# Patient Record
Sex: Female | Born: 1965 | Race: White | Hispanic: No | Marital: Single
Health system: Southern US, Community
[De-identification: ages and names within clinical notes are randomized; demographics above are authoritative.]

---

## 2003-12-26 ENCOUNTER — Other Ambulatory Visit: Admission: RE | Admit: 2003-12-26 | Discharge: 2003-12-26 | Payer: Self-pay | Admitting: Family Medicine

## 2007-01-05 ENCOUNTER — Other Ambulatory Visit: Admission: RE | Admit: 2007-01-05 | Discharge: 2007-01-05 | Payer: Self-pay | Admitting: Family Medicine

## 2013-10-31 ENCOUNTER — Other Ambulatory Visit: Payer: Self-pay | Admitting: Family Medicine

## 2013-10-31 DIAGNOSIS — Z1231 Encounter for screening mammogram for malignant neoplasm of breast: Secondary | ICD-10-CM

## 2013-11-07 ENCOUNTER — Ambulatory Visit
Admission: RE | Admit: 2013-11-07 | Discharge: 2013-11-07 | Disposition: A | Discharge status: Home / Self Care | Payer: BC Managed Care – PPO | Source: Ambulatory Visit | Attending: Family Medicine | Admitting: Family Medicine

## 2013-11-07 ENCOUNTER — Ambulatory Visit: Payer: Self-pay

## 2013-11-07 ENCOUNTER — Encounter (INDEPENDENT_AMBULATORY_CARE_PROVIDER_SITE_OTHER): Payer: Self-pay

## 2013-11-07 DIAGNOSIS — Z1231 Encounter for screening mammogram for malignant neoplasm of breast: Secondary | ICD-10-CM

## 2015-06-05 ENCOUNTER — Other Ambulatory Visit: Payer: Self-pay | Admitting: Family Medicine

## 2015-06-05 ENCOUNTER — Other Ambulatory Visit: Payer: Self-pay

## 2015-06-05 DIAGNOSIS — N6320 Unspecified lump in the left breast, unspecified quadrant: Secondary | ICD-10-CM

## 2015-06-12 ENCOUNTER — Other Ambulatory Visit: Payer: Self-pay | Admitting: Family Medicine

## 2015-06-12 ENCOUNTER — Ambulatory Visit
Admission: RE | Admit: 2015-06-12 | Discharge: 2015-06-12 | Disposition: A | Discharge status: Home / Self Care | Payer: BLUE CROSS/BLUE SHIELD | Source: Ambulatory Visit | Attending: Family Medicine | Admitting: Family Medicine

## 2015-06-12 DIAGNOSIS — N6320 Unspecified lump in the left breast, unspecified quadrant: Secondary | ICD-10-CM

## 2015-06-15 ENCOUNTER — Ambulatory Visit
Admission: RE | Admit: 2015-06-15 | Discharge: 2015-06-15 | Disposition: A | Discharge status: Home / Self Care | Payer: BLUE CROSS/BLUE SHIELD | Source: Ambulatory Visit | Attending: Family Medicine | Admitting: Family Medicine

## 2015-06-15 DIAGNOSIS — N6320 Unspecified lump in the left breast, unspecified quadrant: Secondary | ICD-10-CM

## 2016-12-11 ENCOUNTER — Other Ambulatory Visit: Payer: Self-pay | Admitting: Family Medicine

## 2016-12-11 DIAGNOSIS — Z1231 Encounter for screening mammogram for malignant neoplasm of breast: Secondary | ICD-10-CM

## 2016-12-24 ENCOUNTER — Ambulatory Visit: Payer: BLUE CROSS/BLUE SHIELD

## 2016-12-31 ENCOUNTER — Ambulatory Visit
Admission: RE | Admit: 2016-12-31 | Discharge: 2016-12-31 | Disposition: A | Discharge status: Home / Self Care | Payer: BLUE CROSS/BLUE SHIELD | Source: Ambulatory Visit | Attending: Family Medicine | Admitting: Family Medicine

## 2016-12-31 DIAGNOSIS — Z1231 Encounter for screening mammogram for malignant neoplasm of breast: Secondary | ICD-10-CM

## 2018-05-12 ENCOUNTER — Other Ambulatory Visit: Payer: Self-pay | Admitting: Family Medicine

## 2018-05-12 DIAGNOSIS — Z1231 Encounter for screening mammogram for malignant neoplasm of breast: Secondary | ICD-10-CM

## 2018-06-24 ENCOUNTER — Ambulatory Visit
Admission: RE | Admit: 2018-06-24 | Discharge: 2018-06-24 | Disposition: A | Discharge status: Home / Self Care | Payer: BLUE CROSS/BLUE SHIELD | Source: Ambulatory Visit | Attending: Family Medicine | Admitting: Family Medicine

## 2018-06-24 DIAGNOSIS — Z1231 Encounter for screening mammogram for malignant neoplasm of breast: Secondary | ICD-10-CM

## 2019-05-19 ENCOUNTER — Other Ambulatory Visit: Payer: Self-pay | Admitting: Family Medicine

## 2019-05-19 DIAGNOSIS — Z1231 Encounter for screening mammogram for malignant neoplasm of breast: Secondary | ICD-10-CM

## 2019-07-13 ENCOUNTER — Ambulatory Visit
Admission: RE | Admit: 2019-07-13 | Discharge: 2019-07-13 | Disposition: A | Discharge status: Home / Self Care | Payer: PRIVATE HEALTH INSURANCE | Source: Ambulatory Visit | Attending: Family Medicine | Admitting: Family Medicine

## 2019-07-13 ENCOUNTER — Other Ambulatory Visit: Payer: Self-pay

## 2019-07-13 DIAGNOSIS — Z1231 Encounter for screening mammogram for malignant neoplasm of breast: Secondary | ICD-10-CM

## 2019-08-22 ENCOUNTER — Encounter: Payer: Self-pay | Admitting: Family Medicine

## 2019-10-15 ENCOUNTER — Ambulatory Visit: Payer: PRIVATE HEALTH INSURANCE | Attending: Internal Medicine

## 2019-10-15 DIAGNOSIS — Z23 Encounter for immunization: Secondary | ICD-10-CM

## 2019-10-15 NOTE — Progress Notes (Signed)
   Covid-19 Vaccination Clinic  Name:  Madison Rivera    MRN: [id number] DOB: 1966-03-31  10/15/2019  [name] was observed post Covid-19 immunization for 15 minutes without incident. She was provided with Vaccine Information Sheet and instruction to access the V-Safe system.   [name] was instructed to call 911 with any severe reactions post vaccine: Marland Kitchen Difficulty breathing  . Swelling of face and throat  . A fast heartbeat  . A bad rash all over body  . Dizziness and weakness   Immunizations Administered    Name Date Dose VIS Date Route   Pfizer COVID-19 Vaccine 10/15/2019 12:40 PM 0.3 mL 06/17/2019 Intramuscular   Manufacturer: [employer], [employer]   Lot: [id number]   NDC: [address]-4

## 2019-11-07 ENCOUNTER — Ambulatory Visit: Payer: PRIVATE HEALTH INSURANCE | Attending: Internal Medicine

## 2019-11-07 DIAGNOSIS — Z23 Encounter for immunization: Secondary | ICD-10-CM

## 2019-11-07 NOTE — Progress Notes (Signed)
   Covid-19 Vaccination Clinic  Name:  Inza Mikrut    MRN: [id number] DOB: [birth year]  11/07/2019  Ms. Clowers was observed post Covid-19 immunization for 15 minutes without incident. She was provided with Vaccine Information Sheet and instruction to access the V-Safe system.   Ms. Crosland was instructed to call 911 with any severe reactions post vaccine: Marland Kitchen Difficulty breathing  . Swelling of face and throat  . A fast heartbeat  . A bad rash all over body  . Dizziness and weakness   Immunizations Administered    Name Date Dose VIS Date Route   Pfizer COVID-19 Vaccine 11/07/2019  3:23 PM 0.3 mL 08/31/2018 Intramuscular   Manufacturer: [employer], [employer]   Lot: [id number]   NDC: [address]-9

## 2020-07-10 ENCOUNTER — Other Ambulatory Visit: Payer: Self-pay | Admitting: Family Medicine

## 2020-07-10 DIAGNOSIS — Z1231 Encounter for screening mammogram for malignant neoplasm of breast: Secondary | ICD-10-CM

## 2020-09-07 ENCOUNTER — Ambulatory Visit: Payer: PRIVATE HEALTH INSURANCE

## 2020-09-14 ENCOUNTER — Other Ambulatory Visit: Payer: Self-pay

## 2020-09-14 ENCOUNTER — Ambulatory Visit
Admission: RE | Admit: 2020-09-14 | Discharge: 2020-09-14 | Disposition: A | Discharge status: Home / Self Care | Payer: BC Managed Care – PPO | Source: Ambulatory Visit | Attending: Family Medicine | Admitting: Family Medicine

## 2020-09-14 DIAGNOSIS — Z1231 Encounter for screening mammogram for malignant neoplasm of breast: Secondary | ICD-10-CM

## 2020-12-28 IMAGING — MG DIGITAL SCREENING BILAT W/ TOMO W/ CAD
8 series · 8 of 24 positions shown · non-contrast
Comparison: Previous exam(s).

CLINICAL DATA: Screening.

EXAM:
DIGITAL SCREENING BILATERAL MAMMOGRAM WITH TOMO AND CAD

[L MLO synth-2D]
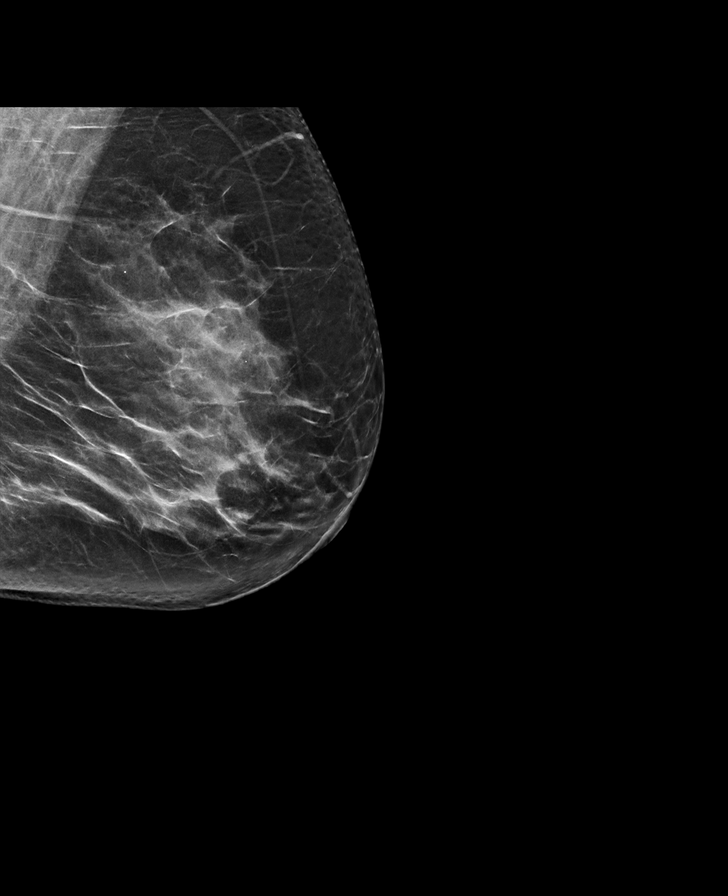

[R CC synth-2D]
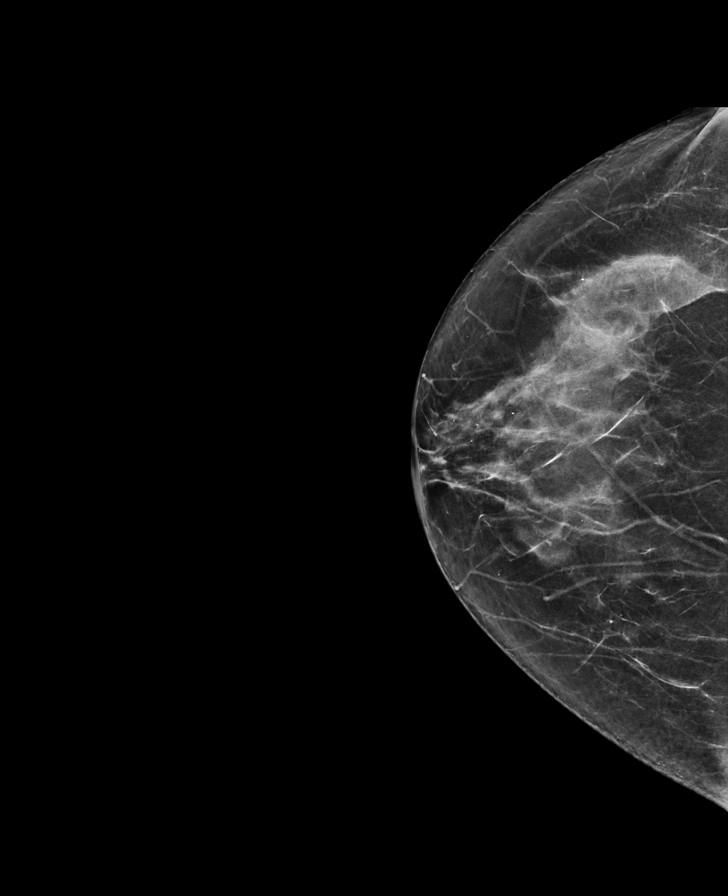

[L CC synth-2D]
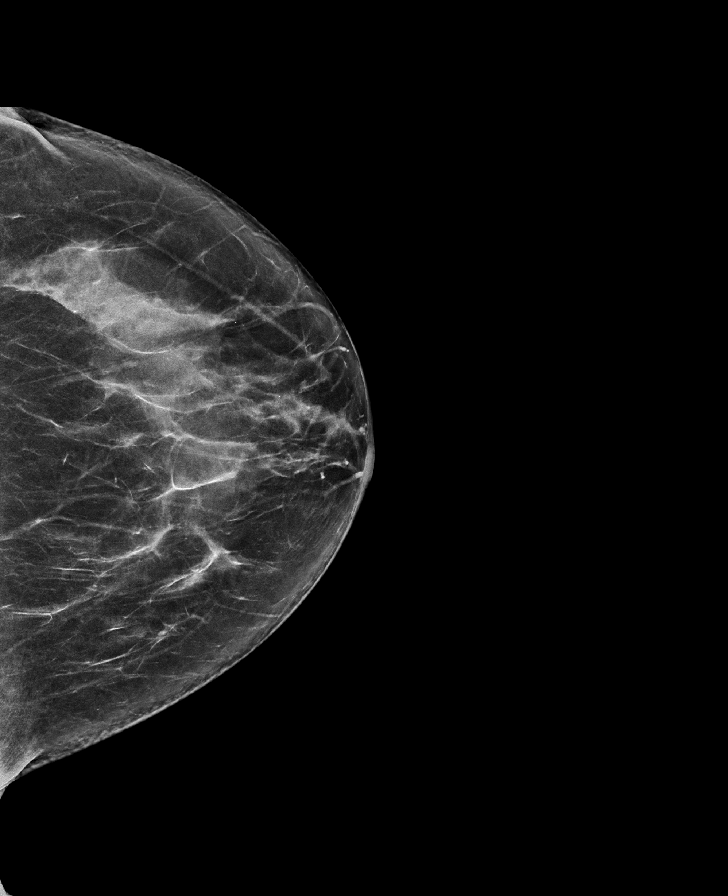

[R MLO synth-2D]
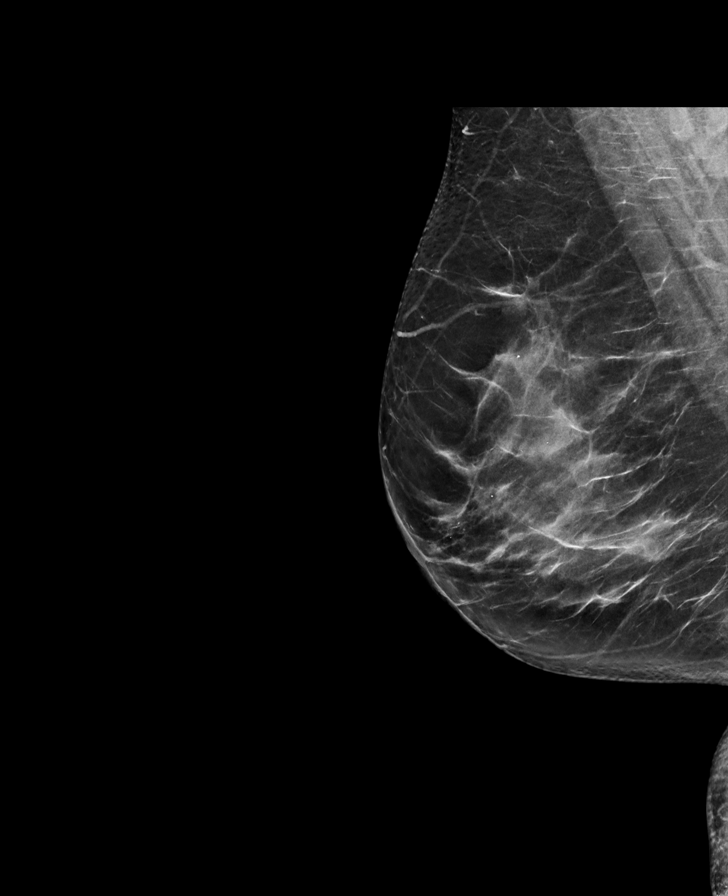

[L MLO tomo · tomo slice 39/77.0]
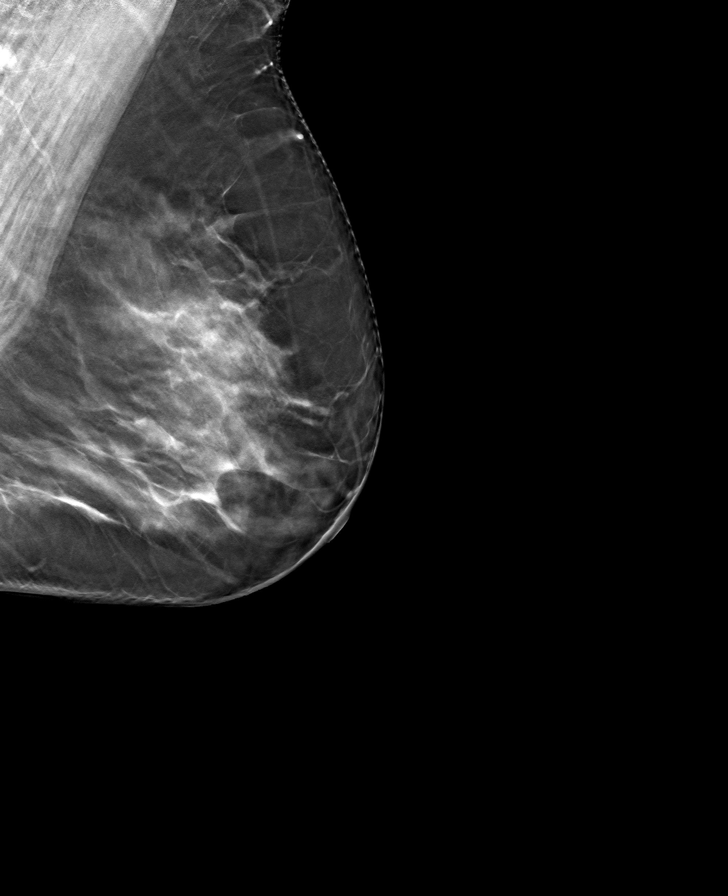

[R MLO tomo · tomo slice 38/75.0]
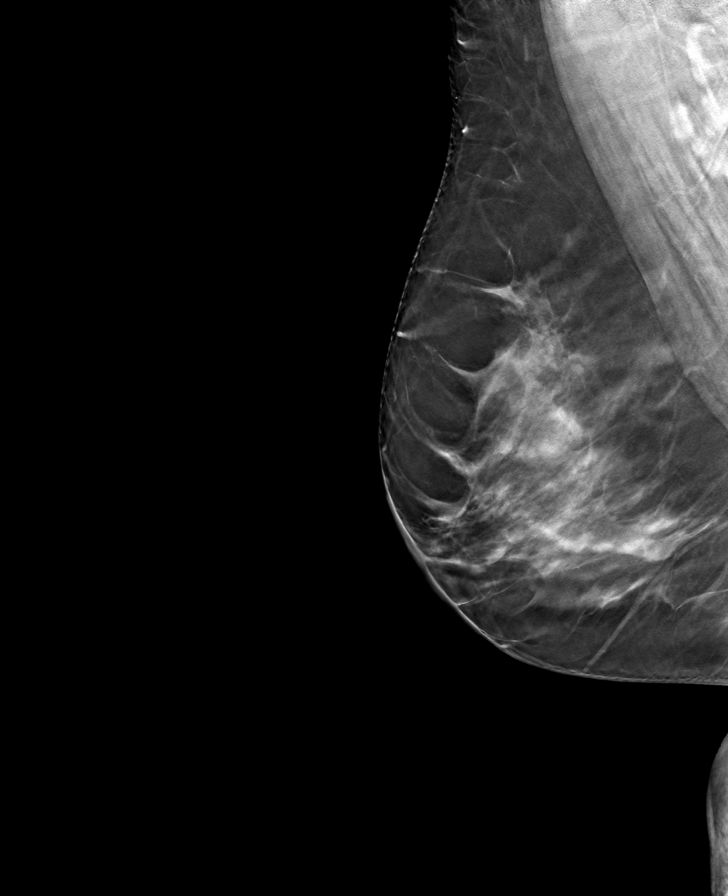

[R CC tomo · tomo slice 35/68.0]
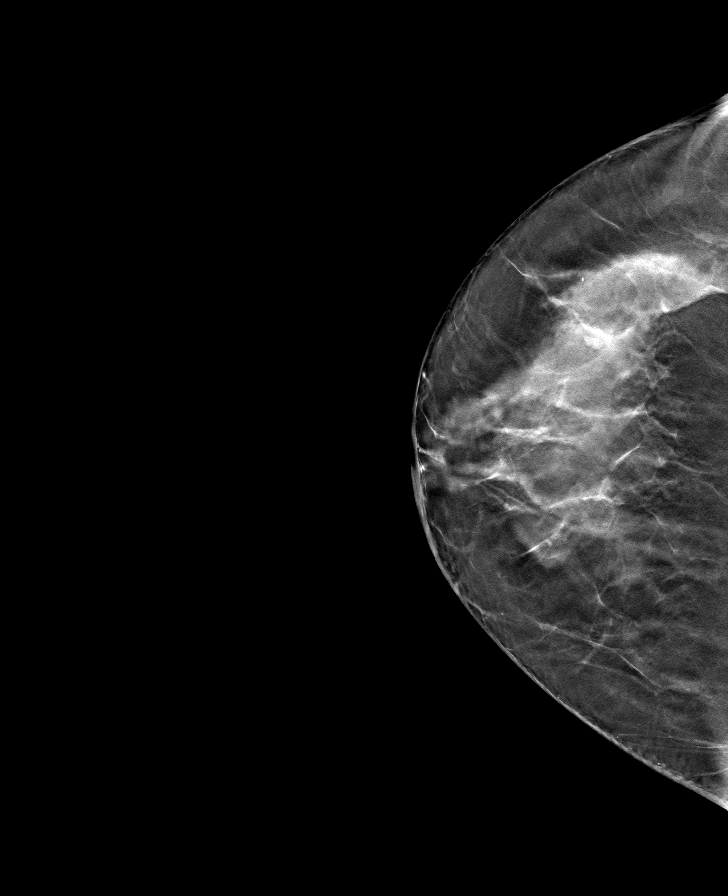

[L CC tomo · tomo slice 37/74.0]
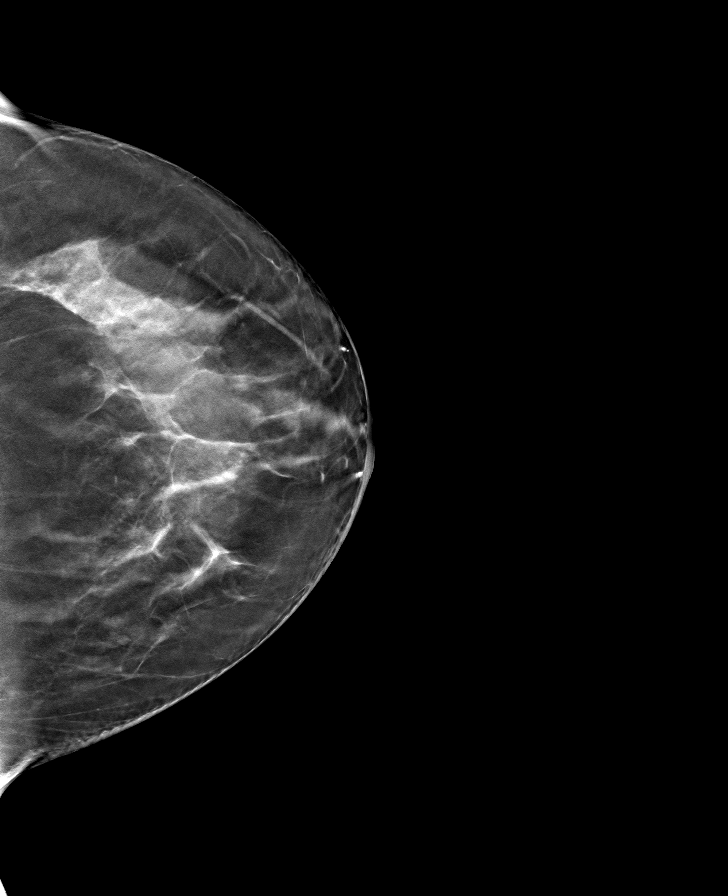

[8 of 24 positions shown; findings below may reference images not displayed]

ACR Breast Density Category c: The breast tissue is heterogeneously
dense, which may obscure small masses.
FINDINGS: There are no findings suspicious for malignancy. Images were
processed with CAD.
IMPRESSION: No mammographic evidence of malignancy. A result letter of this
screening mammogram will be mailed directly to the patient.

RECOMMENDATION:
Screening mammogram in one year. (Code:[id number])

BI-RADS CATEGORY  1: Negative.

## 2021-08-13 ENCOUNTER — Other Ambulatory Visit: Payer: Self-pay | Admitting: Family Medicine

## 2021-08-13 DIAGNOSIS — Z1231 Encounter for screening mammogram for malignant neoplasm of breast: Secondary | ICD-10-CM

## 2021-09-16 ENCOUNTER — Ambulatory Visit
Admission: RE | Admit: 2021-09-16 | Discharge: 2021-09-16 | Disposition: A | Discharge status: Home / Self Care | Payer: BC Managed Care – PPO | Source: Ambulatory Visit | Attending: Family Medicine | Admitting: Family Medicine

## 2021-09-16 DIAGNOSIS — Z1231 Encounter for screening mammogram for malignant neoplasm of breast: Secondary | ICD-10-CM

## 2022-08-04 ENCOUNTER — Other Ambulatory Visit: Payer: Self-pay | Admitting: Family Medicine

## 2022-08-04 DIAGNOSIS — Z1231 Encounter for screening mammogram for malignant neoplasm of breast: Secondary | ICD-10-CM

## 2022-09-23 ENCOUNTER — Ambulatory Visit
Admission: RE | Admit: 2022-09-23 | Discharge: 2022-09-23 | Disposition: A | Discharge status: Home / Self Care | Payer: No Typology Code available for payment source | Source: Ambulatory Visit | Attending: Family Medicine | Admitting: Family Medicine

## 2022-09-23 DIAGNOSIS — Z1231 Encounter for screening mammogram for malignant neoplasm of breast: Secondary | ICD-10-CM

## 2023-09-09 ENCOUNTER — Other Ambulatory Visit: Payer: Self-pay | Admitting: Family Medicine

## 2023-09-09 DIAGNOSIS — Z1231 Encounter for screening mammogram for malignant neoplasm of breast: Secondary | ICD-10-CM

## 2023-09-25 ENCOUNTER — Ambulatory Visit

## 2023-10-02 ENCOUNTER — Ambulatory Visit
Admission: RE | Admit: 2023-10-02 | Discharge: 2023-10-02 | Disposition: A | Discharge status: Home / Self Care | Source: Ambulatory Visit | Attending: Family Medicine | Admitting: Family Medicine

## 2023-10-02 DIAGNOSIS — Z1231 Encounter for screening mammogram for malignant neoplasm of breast: Secondary | ICD-10-CM
